# Patient Record
Sex: Female | Born: 1935 | Hispanic: No | Smoking: Never smoker
Health system: Southern US, Community
[De-identification: ages and names within clinical notes are randomized; demographics above are authoritative.]

## PROBLEM LIST (undated history)

## (undated) DIAGNOSIS — M1990 Unspecified osteoarthritis, unspecified site: Secondary | ICD-10-CM

## (undated) HISTORY — DX: Unspecified osteoarthritis, unspecified site: M19.90

---

## 2005-01-08 ENCOUNTER — Emergency Department: Payer: Self-pay | Admitting: Emergency Medicine

## 2005-01-08 ENCOUNTER — Other Ambulatory Visit: Payer: Self-pay

## 2006-02-02 ENCOUNTER — Ambulatory Visit: Payer: Self-pay | Admitting: Neurology

## 2007-04-11 ENCOUNTER — Ambulatory Visit: Payer: Self-pay | Admitting: Internal Medicine

## 2008-08-20 ENCOUNTER — Ambulatory Visit: Payer: Self-pay | Admitting: Internal Medicine

## 2008-11-11 ENCOUNTER — Ambulatory Visit: Payer: Self-pay | Admitting: Internal Medicine

## 2008-11-20 ENCOUNTER — Ambulatory Visit: Payer: Self-pay | Admitting: Internal Medicine

## 2009-03-10 ENCOUNTER — Ambulatory Visit: Payer: Self-pay | Admitting: Internal Medicine

## 2009-10-14 IMAGING — CR DG CHEST 2V
1 series · 2 of 2 positions shown · non-contrast
Comparison: none

REASON FOR EXAM: shortness of breath
COMMENTS:

PROCEDURE:     DXR - DXR CHEST PA (OR AP) AND LATERAL  - November 11, 2008  [DATE]
RESULT:     The lung fields are clear. No pneumonia, pneumothorax or pleural
effusion is seen. Heart size is normal. No significant osseous abnormalities
are identified.

[Series 1: view not recorded · 0.17mm/px · 2 of 2 slices shown]
[im 1/2]
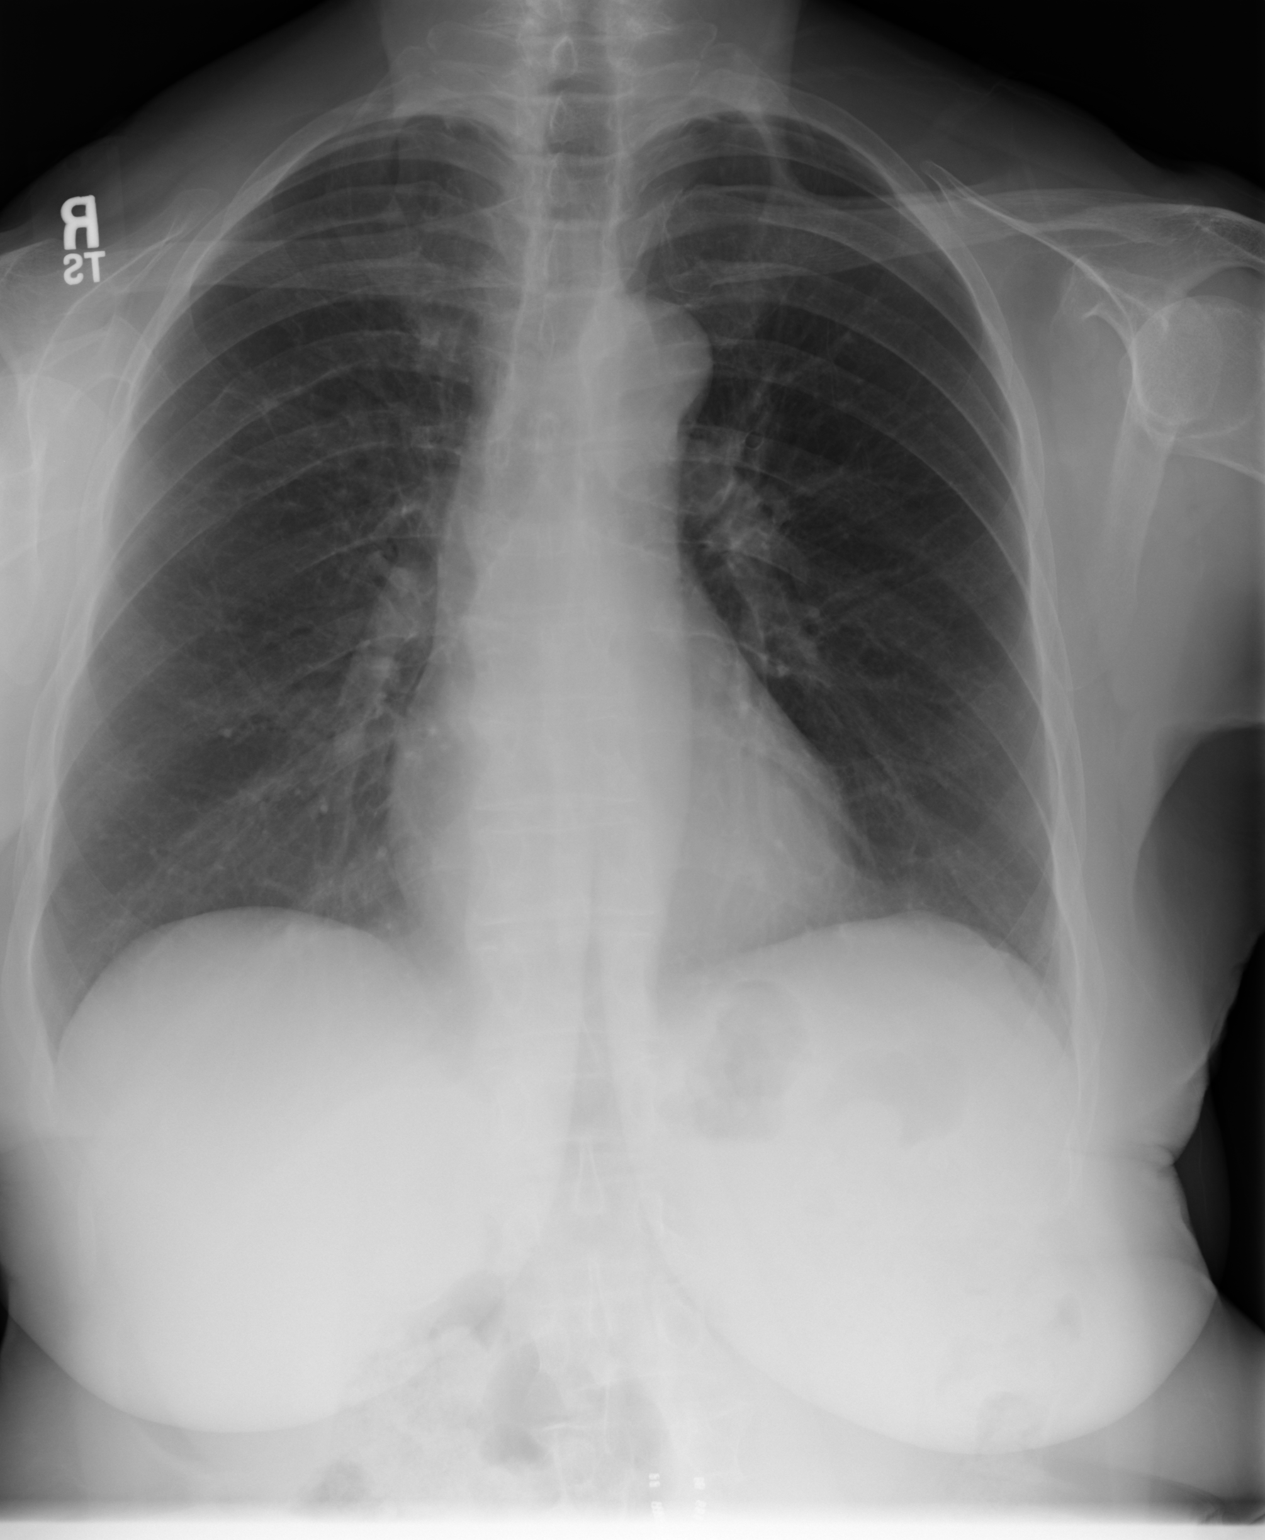
[im 2/2]
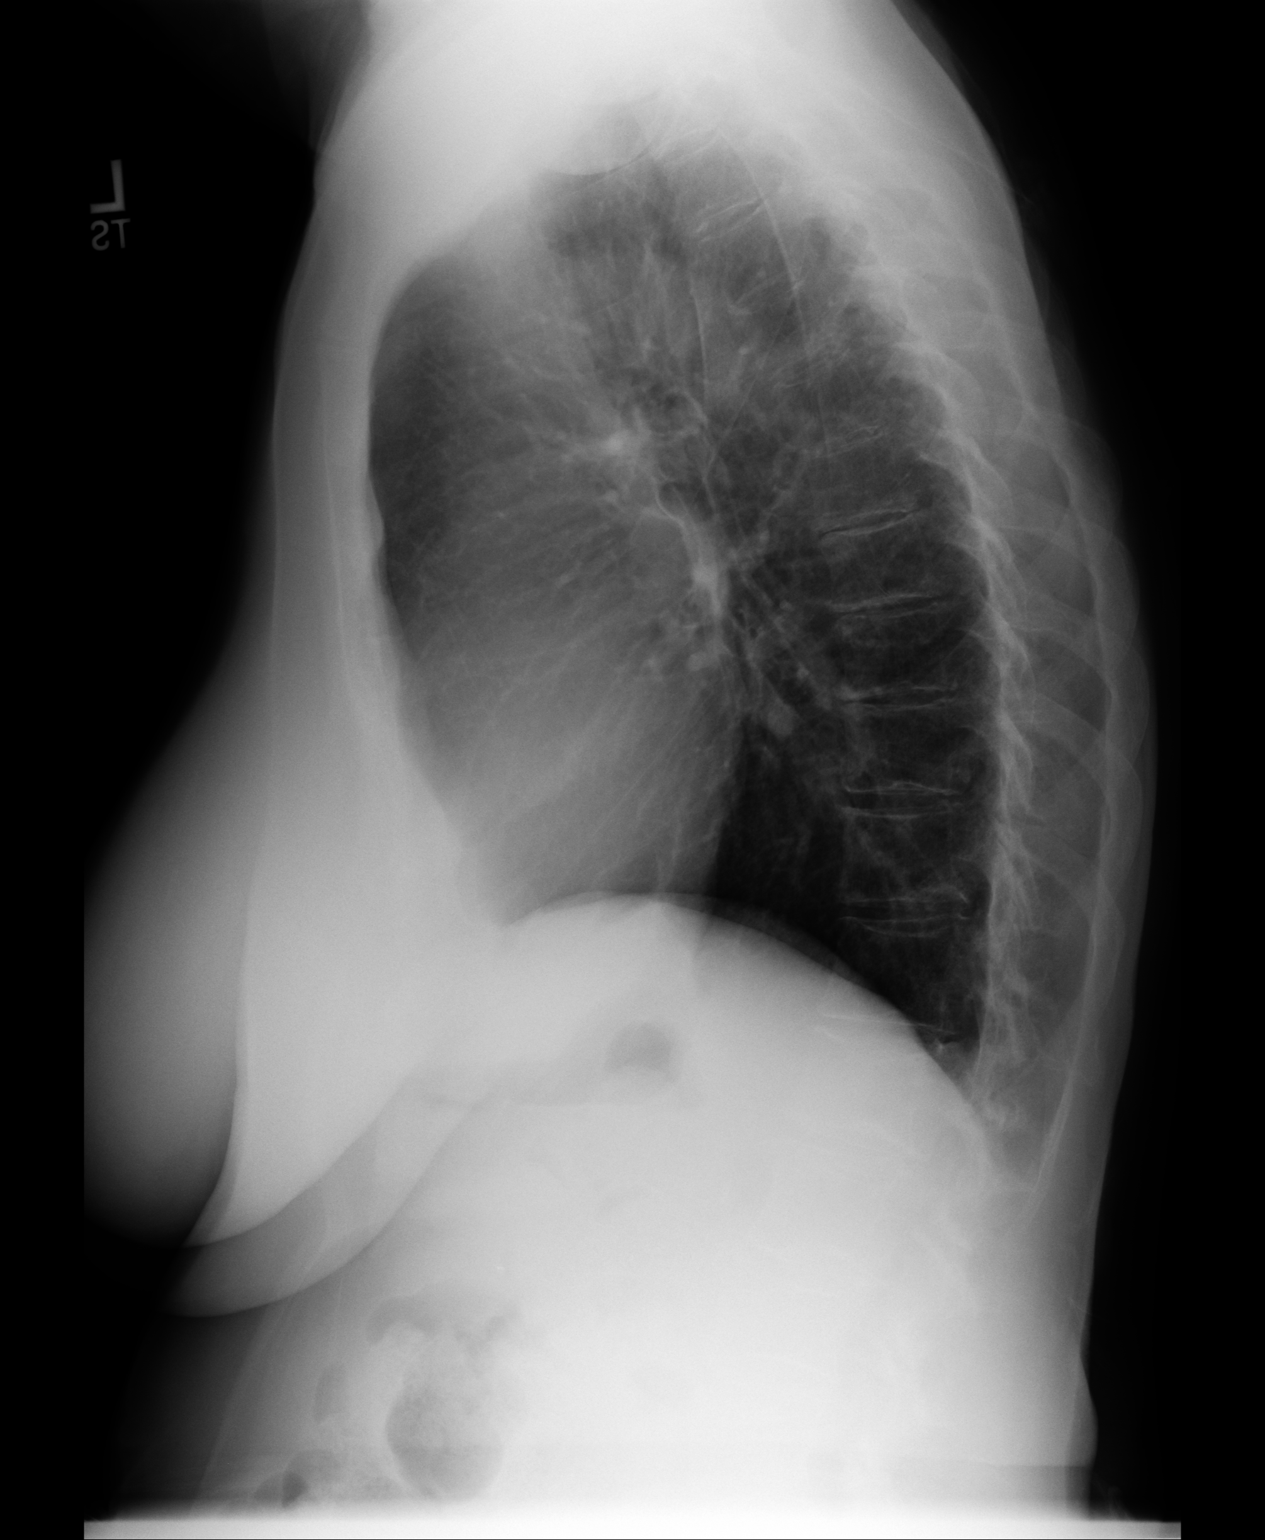

[2 of 2 positions shown; findings below may reference images not displayed]

IMPRESSION: 1.     No significant abnormalities are noted.

## 2012-04-26 ENCOUNTER — Emergency Department: Payer: Self-pay | Admitting: Emergency Medicine

## 2012-04-26 LAB — PROTIME-INR: Prothrombin Time: 13.3 secs (ref 11.5–14.7)

## 2012-04-26 LAB — COMPREHENSIVE METABOLIC PANEL
Bilirubin,Total: 0.3 mg/dL (ref 0.2–1.0)
Chloride: 103 mmol/L (ref 98–107)
Co2: 32 mmol/L (ref 21–32)
Creatinine: 0.93 mg/dL (ref 0.60–1.30)
EGFR (African American): >60
EGFR (Non-African Amer.): 59 — ABNORMAL LOW
Osmolality: 279 (ref 275–301)
SGOT(AST): 21 U/L (ref 15–37)
SGPT (ALT): 16 U/L (ref 12–78)
Sodium: 140 mmol/L (ref 136–145)

## 2012-04-26 LAB — CK TOTAL AND CKMB (NOT AT ARMC)
CK, Total: 52 U/L (ref 21–215)
CK-MB: 0.8 ng/mL (ref 0.5–3.6)

## 2012-04-26 LAB — CBC
HCT: 36.9 % (ref 35.0–47.0)
HGB: 12.4 g/dL (ref 12.0–16.0)
MCHC: 33.5 g/dL (ref 32.0–36.0)
MCV: 98 fL (ref 80–100)
RBC: 3.76 10*6/uL — ABNORMAL LOW (ref 3.80–5.20)
RDW: 13.2 % (ref 11.5–14.5)
WBC: 5.3 10*3/uL (ref 3.6–11.0)

## 2012-08-21 ENCOUNTER — Ambulatory Visit: Payer: Self-pay | Admitting: Internal Medicine

## 2012-12-25 ENCOUNTER — Ambulatory Visit: Payer: Self-pay | Admitting: Internal Medicine

## 2014-04-10 ENCOUNTER — Ambulatory Visit (INDEPENDENT_AMBULATORY_CARE_PROVIDER_SITE_OTHER): Payer: Medicare Other

## 2014-04-10 ENCOUNTER — Ambulatory Visit (INDEPENDENT_AMBULATORY_CARE_PROVIDER_SITE_OTHER): Payer: Medicare Other | Admitting: Podiatry

## 2014-04-10 ENCOUNTER — Encounter: Payer: Self-pay | Admitting: Podiatry

## 2014-04-10 VITALS — BP 149/84 | HR 67 | Resp 16 | Ht 62.0 in | Wt 130.0 lb

## 2014-04-10 DIAGNOSIS — G589 Mononeuropathy, unspecified: Secondary | ICD-10-CM

## 2014-04-10 DIAGNOSIS — G629 Polyneuropathy, unspecified: Secondary | ICD-10-CM

## 2014-04-10 DIAGNOSIS — B0229 Other postherpetic nervous system involvement: Secondary | ICD-10-CM

## 2014-04-10 NOTE — Progress Notes (Signed)
   Subjective:    Patient ID: Misty Townsend, female    DOB: 1931/12/02, 80 y.o.   MRN: [id number]  HPI Comments: i have a burning in my left foot. This has been going on for 1 month. Its getting better. The pain feels like a bolt of lightning. It hurts to wear shoes. i have to go bare foot. At night time the covers will hurt. i have to put my foot out of the cover. Its hot feeling at times. i take gabapentin and its helping.   Foot Pain      Review of Systems  HENT:       Ringing in ears  Musculoskeletal:       Back pain  Neurological: Positive for dizziness.  All other systems reviewed and are negative.      Objective:   Physical Exam: She has recently healed from the shingles that extended from her hip to the plantar aspect of her foot. She states that her primary Dr. his treating her for postherpetic neuralgia. Pulses are strongly palpable bilateral. Neurologic sensorium is intact per Semmes-Weinstein monofilament and hypersensitivity to the medial aspect of the first metatarsophalangeal joint area. She still has tenderness and pain on palpation dorsal aspect of the left foot. Deep tendon reflexes are intact bilateral muscle strength is 5 over 5 dorsiflexors plantar flexors inverters everters all intrinsic musculature is intact orthopedic evaluation demonstrates hallux abductovalgus deformity with mild pes planus bilateral flexible hammertoe deformities are noted bilateral. Otherwise all joints distal to the ankle a full range of motion without crepitation. Cutaneous evaluation demonstrates well hydrated cutis mild edema about the left foot no calf pain no ulcerations however she does have some dried that supple from the herpetic flare.        Assessment & Plan:  Assessment: Postherpetic neuralgia left foot.  Plan: Continue same course gabapentin 300 mg 1 by mouth 3 times a day followup with me on an as-needed basis. We did discuss the use of capsaicin cream. She cannot take  Lyrica.

## 2019-06-25 ENCOUNTER — Other Ambulatory Visit: Payer: Self-pay

## 2019-06-25 ENCOUNTER — Other Ambulatory Visit: Payer: Self-pay | Admitting: Internal Medicine

## 2019-06-25 ENCOUNTER — Ambulatory Visit
Admission: RE | Admit: 2019-06-25 | Discharge: 2019-06-25 | Disposition: A | Discharge status: Home / Self Care | Payer: Medicare Other | Source: Ambulatory Visit | Attending: Internal Medicine | Admitting: Internal Medicine

## 2019-06-25 DIAGNOSIS — R52 Pain, unspecified: Secondary | ICD-10-CM

## 2019-07-18 ENCOUNTER — Ambulatory Visit
Admission: RE | Admit: 2019-07-18 | Discharge: 2019-07-18 | Disposition: A | Discharge status: Home / Self Care | Payer: Medicare Other | Source: Ambulatory Visit | Attending: Internal Medicine | Admitting: Internal Medicine

## 2019-07-18 ENCOUNTER — Other Ambulatory Visit: Payer: Self-pay

## 2019-07-18 ENCOUNTER — Ambulatory Visit
Admission: RE | Admit: 2019-07-18 | Discharge: 2019-07-18 | Disposition: A | Discharge status: Home / Self Care | Payer: Medicare Other | Attending: Internal Medicine | Admitting: Internal Medicine

## 2019-07-18 ENCOUNTER — Other Ambulatory Visit: Payer: Self-pay | Admitting: Internal Medicine

## 2019-07-18 DIAGNOSIS — M1611 Unilateral primary osteoarthritis, right hip: Secondary | ICD-10-CM | POA: Diagnosis present

## 2019-07-18 DIAGNOSIS — M25551 Pain in right hip: Secondary | ICD-10-CM | POA: Diagnosis present

## 2019-11-25 ENCOUNTER — Encounter: Payer: Self-pay | Admitting: Internal Medicine

## 2019-12-20 ENCOUNTER — Ambulatory Visit: Payer: Medicare Other | Admitting: Internal Medicine

## 2020-01-01 ENCOUNTER — Ambulatory Visit: Payer: Medicare Other | Admitting: Internal Medicine

## 2020-01-08 ENCOUNTER — Ambulatory Visit (INDEPENDENT_AMBULATORY_CARE_PROVIDER_SITE_OTHER): Payer: Medicare Other | Admitting: Internal Medicine

## 2020-01-08 ENCOUNTER — Other Ambulatory Visit: Payer: Self-pay

## 2020-01-08 ENCOUNTER — Telehealth: Payer: Self-pay

## 2020-01-08 ENCOUNTER — Encounter: Payer: Self-pay | Admitting: Internal Medicine

## 2020-01-08 VITALS — BP 159/79 | HR 82 | Wt 110.1 lb

## 2020-01-08 DIAGNOSIS — G729 Myopathy, unspecified: Secondary | ICD-10-CM | POA: Diagnosis not present

## 2020-01-08 DIAGNOSIS — I10 Essential (primary) hypertension: Secondary | ICD-10-CM | POA: Diagnosis not present

## 2020-01-08 MED ORDER — METOPROLOL SUCCINATE ER 25 MG PO TB24: 25.0000 mg | ORAL_TABLET | Freq: Every day | ORAL | 3 refills | Status: DC

## 2020-01-08 MED ORDER — MELOXICAM 15 MG PO TABS: 15.0000 mg | ORAL_TABLET | Freq: Every day | ORAL | 6 refills | Status: DC

## 2020-01-08 NOTE — Assessment & Plan Note (Signed)
We will start her on Metroprolol.

## 2020-01-08 NOTE — Assessment & Plan Note (Signed)
We will provide her with a raised commode.  And she will need physical therapy exercises.  She can get up from the chair to standing position by herself.

## 2020-01-08 NOTE — Progress Notes (Signed)
Established Patient Office Visit  Subjective:  Patient ID: Misty Townsend, female    DOB: May 28, 1932  Age: 86 y.o. MRN: [id number]  CC:  Chief Complaint  Patient presents with  . Hypertension    patient states that her BP has been elevated and not taking anything for BP     HPI  Misty Townsend presents for her blood pressure check her blood pressure was found to be elevated.  She also complains of some tightness in the chest patient thinks is due to stress at home.  She also has muscle weakness and has trouble getting up from the sitting up position.  Denies any shortness of breath swelling of the legs abdominal pain.  History reviewed. No pertinent past medical history.  History reviewed. No pertinent surgical history.  History reviewed. No pertinent family history.  Social History   Socioeconomic History  . Marital status: Unknown    Spouse name: Not on file  . Number of children: Not on file  . Years of education: Not on file  . Highest education level: Not on file  Occupational History  . Not on file  Tobacco Use  . Smoking status: Never Smoker  Substance and Sexual Activity  . Alcohol use: No  . Drug use: Not on file  . Sexual activity: Not on file  Other Topics Concern  . Not on file  Social History Narrative  . Not on file   Social Determinants of Health   Financial Resource Strain:   . Difficulty of Paying Living Expenses:   Food Insecurity:   . Worried About Charity fundraiser in the Last Year:   . Arboriculturist in the Last Year:   Transportation Needs:   . Film/video editor (Medical):   Marland Kitchen Lack of Transportation (Non-Medical):   Physical Activity:   . Days of Exercise per Week:   . Minutes of Exercise per Session:   Stress:   . Feeling of Stress :   Social Connections:   . Frequency of Communication with Friends and Family:   . Frequency of Social Gatherings with Friends and Family:   . Attends Religious Services:   . Active Member of Clubs  or Organizations:   . Attends Archivist Meetings:   Marland Kitchen Marital Status:   Intimate Partner Violence:   . Fear of Current or Ex-Partner:   . Emotionally Abused:   Marland Kitchen Physically Abused:   . Sexually Abused:      Current Outpatient Medications:  .  meloxicam (MOBIC) 15 MG tablet, Take 1 tablet (15 mg total) by mouth daily., Disp: 30 tablet, Rfl: 6 .  metoprolol succinate (TOPROL-XL) 25 MG 24 hr tablet, Take 1 tablet (25 mg total) by mouth daily., Disp: 30 tablet, Rfl: 3   Allergies  Allergen Reactions  . Sulfa Antibiotics Other (See Comments)    Dry throat    ROS Review of Systems  Constitutional: Negative for appetite change.  HENT: Negative for congestion.   Eyes: Negative for pain.  Respiratory: Positive for chest tightness. Negative for shortness of breath.   Cardiovascular: Negative for leg swelling.  Gastrointestinal: Negative for abdominal pain.  Musculoskeletal: Negative for arthralgias and back pain.  Neurological: Negative for dizziness.  Psychiatric/Behavioral: Negative for behavioral problems.      Objective:    Physical Exam  Constitutional: She appears well-developed.  HENT:  Head: Normocephalic and atraumatic.  Eyes: Pupils are equal, round, and reactive to light.  Neck: No  tracheal deviation present. No thyromegaly present.  Cardiovascular: Normal rate.  Systolic murmur left sternal border.  Grade 2 / 6 crescendo decrescendo.  Musculoskeletal:     Cervical back: Normal range of motion and neck supple.    BP (!) 159/79   Pulse 82   Wt 110 lb 1.6 oz (49.9 kg)   BMI 20.14 kg/m  Wt Readings from Last 3 Encounters:  01/08/20 110 lb 1.6 oz (49.9 kg)  10/22/19 110 lb 6.1 oz (50.1 kg)  04/10/14 130 lb (59 kg)     Health Maintenance Due  Topic Date Due  . COVID-19 Vaccine (1) Never done  . TETANUS/TDAP  Never done  . DEXA SCAN  Never done  . PNA vac Low Risk Adult (1 of 2 - PCV13) Never done    There are no preventive care reminders to  display for this patient.  No results found for: TSH Lab Results  Component Value Date   WBC 5.3 04/26/2012   HGB 12.4 04/26/2012   HCT 36.9 04/26/2012   MCV 98 04/26/2012   PLT 180 04/26/2012   Lab Results  Component Value Date   NA 140 04/26/2012   K 4.1 04/26/2012   CO2 32 04/26/2012   GLUCOSE 101 (H) 04/26/2012   BUN 10 04/26/2012   CREATININE 0.93 04/26/2012   BILITOT 0.3 04/26/2012   ALKPHOS 86 04/26/2012   AST 21 04/26/2012   ALT 16 04/26/2012   PROT 7.6 04/26/2012   ALBUMIN 3.8 04/26/2012   CALCIUM 9.2 04/26/2012   ANIONGAP 5 (L) 04/26/2012   No results found for: CHOL No results found for: HDL No results found for: LDLCALC No results found for: TRIG No results found for: CHOLHDL No results found for: KVQQ5Z    Assessment & Plan:   Problem List Items Addressed This Visit      Cardiovascular and Mediastinum   Essential hypertension - Primary    We will start her on Metroprolol.      Relevant Medications   metoprolol succinate (TOPROL-XL) 25 MG 24 hr tablet     Other   Myopathy    We will provide her with a raised commode.  And she will need physical therapy exercises.  She can get up from the chair to standing position by herself.         Meds ordered this encounter  Medications  . meloxicam (MOBIC) 15 MG tablet    Sig: Take 1 tablet (15 mg total) by mouth daily.    Dispense:  30 tablet    Refill:  6  . metoprolol succinate (TOPROL-XL) 25 MG 24 hr tablet    Sig: Take 1 tablet (25 mg total) by mouth daily.    Dispense:  30 tablet    Refill:  3   1. Essential hypertension .  Start on metoprolol - metoprolol succinate (TOPROL-XL) 25 MG 24 hr tablet; Take 1 tablet (25 mg total) by mouth daily.  Dispense: 30 tablet; Ref.  Ill: 3  2. Myopathy Exercises. Follow-up: Return in about 4 weeks (around 02/05/2020).    Corky Downs, MD

## 2020-01-17 NOTE — Telephone Encounter (Signed)
Letter was done during office visit

## 2020-03-03 ENCOUNTER — Other Ambulatory Visit: Payer: Self-pay

## 2020-03-03 DIAGNOSIS — I10 Essential (primary) hypertension: Secondary | ICD-10-CM

## 2020-03-03 MED ORDER — METOPROLOL SUCCINATE ER 25 MG PO TB24: 25.0000 mg | ORAL_TABLET | Freq: Every day | ORAL | 3 refills | Status: DC

## 2020-03-03 MED ORDER — MELOXICAM 15 MG PO TABS: 15.0000 mg | ORAL_TABLET | Freq: Every day | ORAL | 6 refills | Status: DC

## 2020-03-10 ENCOUNTER — Ambulatory Visit (INDEPENDENT_AMBULATORY_CARE_PROVIDER_SITE_OTHER): Payer: Medicare Other | Admitting: Internal Medicine

## 2020-03-10 ENCOUNTER — Encounter: Payer: Self-pay | Admitting: Internal Medicine

## 2020-03-10 ENCOUNTER — Other Ambulatory Visit: Payer: Self-pay

## 2020-03-10 VITALS — BP 142/79 | HR 71 | Ht 59.0 in | Wt 109.1 lb

## 2020-03-10 DIAGNOSIS — M810 Age-related osteoporosis without current pathological fracture: Secondary | ICD-10-CM | POA: Diagnosis not present

## 2020-03-10 DIAGNOSIS — I358 Other nonrheumatic aortic valve disorders: Secondary | ICD-10-CM

## 2020-03-10 DIAGNOSIS — I10 Essential (primary) hypertension: Secondary | ICD-10-CM | POA: Diagnosis not present

## 2020-03-10 DIAGNOSIS — R2681 Unsteadiness on feet: Secondary | ICD-10-CM

## 2020-03-10 DIAGNOSIS — M1611 Unilateral primary osteoarthritis, right hip: Secondary | ICD-10-CM | POA: Diagnosis not present

## 2020-03-10 MED ORDER — ALENDRONATE SODIUM 70 MG PO TABS: 70.0000 mg | ORAL_TABLET | ORAL | 3 refills | Status: DC

## 2020-03-10 MED ORDER — VITAMIN D 25 MCG (1000 UNIT) PO TABS: 1000.0000 [IU] | ORAL_TABLET | Freq: Every day | ORAL | 3 refills | Status: DC

## 2020-03-10 MED ORDER — FOSAMAX PLUS D 70-5600 MG-UNIT PO TABS: 1.0000 | ORAL_TABLET | ORAL | 1 refills | Status: DC

## 2020-03-10 NOTE — Assessment & Plan Note (Signed)
The patient is tender at the right hip. X-ray several months ago revealed osteoarthritis of the right hip. I told her to use a cane when ambulating. She should not lift heavy items or drag items (I.e. trash cans) around. Part of the gait problem is due to neurodegenerative disorder of the brain.

## 2020-03-10 NOTE — Assessment & Plan Note (Signed)
The patient was advised to use a cane when ambulating.

## 2020-03-10 NOTE — Assessment & Plan Note (Signed)
-   Today, the patient's blood pressure is well managed on Metoprolol. - The patient will continue the current treatment regimen.  - I encouraged the patient to eat a low-sodium diet to help control blood pressure. - I encouraged the patient to live an active lifestyle and complete activities that increases heart rate to 85% target heart rate at least 5 times per week for one hour.   

## 2020-03-10 NOTE — Addendum Note (Signed)
Addended by: Jobie Quaker on: 03/10/2020 02:20 PM   Modules accepted: Orders

## 2020-03-10 NOTE — Assessment & Plan Note (Signed)
She does not have any syncope, chest pain, or arrhythmia.

## 2020-03-10 NOTE — Progress Notes (Signed)
Established Patient Office Visit  SUBJECTIVE:  Subjective  Patient ID: Misty Townsend, female    DOB: 1932/05/23  Age: 86 y.o. MRN: [id number]  CC:  Chief Complaint  Patient presents with  . Hypertension    2 month fu     HPI Misty Townsend is a 86 y.o. female presenting today for hypertension follow-up. She is doing well overall. She takes Metoprolol as prescribed.  Today, she reports chronic right hip pain secondary to arthritis. Pain is exacerbated when ambulating. She normally ambulates with the assistance of a cane. She denies shortness of breath, chest pain, and all other symptoms.  History reviewed. No pertinent past medical history.  History reviewed. No pertinent surgical history.  History reviewed. No pertinent family history.  Social History   Socioeconomic History  . Marital status: Unknown    Spouse name: Not on file  . Number of children: Not on file  . Years of education: Not on file  . Highest education level: Not on file  Occupational History  . Not on file  Tobacco Use  . Smoking status: Never Smoker  . Smokeless tobacco: Never Used  Substance and Sexual Activity  . Alcohol use: No  . Drug use: Not on file  . Sexual activity: Not on file  Other Topics Concern  . Not on file  Social History Narrative  . Not on file   Social Determinants of Health   Financial Resource Strain:   . Difficulty of Paying Living Expenses:   Food Insecurity:   . Worried About Programme researcher, broadcasting/film/video in the Last Year:   . Barista in the Last Year:   Transportation Needs:   . Freight forwarder (Medical):   Marland Kitchen Lack of Transportation (Non-Medical):   Physical Activity:   . Days of Exercise per Week:   . Minutes of Exercise per Session:   Stress:   . Feeling of Stress :   Social Connections:   . Frequency of Communication with Friends and Family:   . Frequency of Social Gatherings with Friends and Family:   . Attends Religious Services:   . Active Member of  Clubs or Organizations:   . Attends Banker Meetings:   Marland Kitchen Marital Status:   Intimate Partner Violence:   . Fear of Current or Ex-Partner:   . Emotionally Abused:   Marland Kitchen Physically Abused:   . Sexually Abused:      Current Outpatient Medications:  .  meloxicam (MOBIC) 15 MG tablet, Take 1 tablet (15 mg total) by mouth daily., Disp: 30 tablet, Rfl: 6 .  metoprolol succinate (TOPROL-XL) 25 MG 24 hr tablet, Take 1 tablet (25 mg total) by mouth daily., Disp: 30 tablet, Rfl: 3 .  alendronate-cholecalciferol (FOSAMAX PLUS D) 70-5600 MG-UNIT tablet, Take 1 tablet by mouth every 7 (seven) days. Take with a full glass of water on an empty stomach., Disp: 12 tablet, Rfl: 1   Allergies  Allergen Reactions  . Sulfa Antibiotics Other (See Comments)    Dry throat    ROS Review of Systems  Constitutional: Negative.   HENT: Negative.   Eyes: Negative.   Respiratory: Negative for shortness of breath.   Cardiovascular: Negative for chest pain.  Gastrointestinal: Negative.   Endocrine: Negative.   Genitourinary: Negative.   Musculoskeletal:       Reports chronic right hip pain secondary to arthritis  Skin: Negative.   Allergic/Immunologic: Negative.   Neurological: Negative.   Hematological: Negative.  Psychiatric/Behavioral: Negative.   All other systems reviewed and are negative.    OBJECTIVE:    Physical Exam Vitals reviewed.  Constitutional:      Appearance: Normal appearance.  HENT:     Mouth/Throat:     Mouth: Mucous membranes are moist.  Eyes:     Pupils: Pupils are equal, round, and reactive to light.  Neck:     Vascular: No carotid bruit.  Cardiovascular:     Rate and Rhythm: Normal rate and regular rhythm.     Pulses: Normal pulses.     Heart sounds: Murmur heard.  Systolic murmur is present with a grade of 2/6.   Pulmonary:     Effort: Pulmonary effort is normal.     Breath sounds: Normal breath sounds.  Abdominal:     General: Bowel sounds are  normal.     Palpations: Abdomen is soft. There is no hepatomegaly, splenomegaly or mass.     Tenderness: There is no abdominal tenderness.     Hernia: No hernia is present.  Musculoskeletal:     Cervical back: Neck supple.     Right hip: Tenderness present.     Right lower leg: No edema.     Left lower leg: No edema.     Comments: Scoliosis and kyphosis  Skin:    Findings: No rash.  Neurological:     Mental Status: She is alert and oriented to person, place, and time.     Motor: No weakness.     Gait: Gait abnormal.  Psychiatric:        Mood and Affect: Mood and affect normal.        Behavior: Behavior normal.     BP (!) 142/79   Pulse 71   Ht 4\' 11"  (1.499 m)   Wt 109 lb 1.6 oz (49.5 kg)   BMI 22.04 kg/m  Wt Readings from Last 3 Encounters:  03/10/20 109 lb 1.6 oz (49.5 kg)  01/08/20 110 lb 1.6 oz (49.9 kg)  10/22/19 110 lb 6.1 oz (50.1 kg)    Health Maintenance Due  Topic Date Due  . COVID-19 Vaccine (1) Never done  . TETANUS/TDAP  Never done  . DEXA SCAN  Never done  . PNA vac Low Risk Adult (1 of 2 - PCV13) Never done  . INFLUENZA VACCINE  03/09/2020    There are no preventive care reminders to display for this patient.  CBC Latest Ref Rng & Units 04/26/2012  WBC 3.6 - 11.0 x10 3/mm 3 5.3  Hemoglobin 12.0 - 16.0 g/dL 04/28/2012  Hematocrit 88.6 - 47.0 % 36.9  Platelets 150 - 440 x10 3/mm 3 180   CMP Latest Ref Rng & Units 04/26/2012  Glucose 65 - 99 mg/dL 04/28/2012)  BUN 7 - 18 mg/dL 10  Creatinine 736(K - 8.15 mg/dL 9.47  Sodium 0.76 - 151 mmol/L 140  Potassium 3.5 - 5.1 mmol/L 4.1  Chloride 98 - 107 mmol/L 103  CO2 21 - 32 mmol/L 32  Calcium 8.5 - 10.1 mg/dL 9.2  Total Protein 6.4 - 8.2 g/dL 7.6  Total Bilirubin 0.2 - 1.0 mg/dL 0.3  Alkaline Phos 50 - 136 Unit/L 86  AST 15 - 37 Unit/L 21  ALT 12 - 78 U/L 16    No results found for: TSH Lab Results  Component Value Date   ALBUMIN 3.8 04/26/2012   ANIONGAP 5 (L) 04/26/2012   No results found for: CHOL,  HDL, LDLCALC, CHOLHDL No results found for: TRIG  No results found for: HGBA1C    ASSESSMENT & PLAN:   Problem List Items Addressed This Visit      Cardiovascular and Mediastinum   Essential hypertension    - Today, the patient's blood pressure is well managed on Metoprolol. - The patient will continue the current treatment regimen.  - I encouraged the patient to eat a low-sodium diet to help control blood pressure. - I encouraged the patient to live an active lifestyle and complete activities that increases heart rate to 85% target heart rate at least 5 times per week for one hour.          Musculoskeletal and Integument   Age related osteoporosis - Primary    Started on Fosamax.       Relevant Medications   alendronate-cholecalciferol (FOSAMAX PLUS D) 70-5600 MG-UNIT tablet   Arthritis of right hip    The patient is tender at the right hip. X-ray several months ago revealed osteoarthritis of the right hip. I told her to use a cane when ambulating. She should not lift heavy items or drag items (I.e. trash cans) around. Part of the gait problem is due to neurodegenerative disorder of the brain.         Other   Unsteady gait when walking    The patient was advised to use a cane when ambulating.       Heart murmur, aortic    She does not have any syncope, chest pain, or arrhythmia.          Meds ordered this encounter  Medications  . alendronate-cholecalciferol (FOSAMAX PLUS D) 70-5600 MG-UNIT tablet    Sig: Take 1 tablet by mouth every 7 (seven) days. Take with a full glass of water on an empty stomach.    Dispense:  12 tablet    Refill:  1    Follow-up: Return in about 2 months (around 05/10/2020).    Dr. Woodroe Chen [facility] [address] [place], [place] [address]   By signing my name below, I, Nikki Dom, attest that this documentation has been prepared under the direction and in the presence of Corky Downs, MD. Electronically  Signed: Corky Downs, MD 03/10/20, 12:49 PM   I personally performed the services described in this documentation, which was SCRIBED in my presence. The recorded information has been reviewed and considered accurate. It has been edited as necessary during review. Corky Downs, MD

## 2020-03-10 NOTE — Assessment & Plan Note (Signed)
Started on Fosamax.

## 2020-05-12 ENCOUNTER — Other Ambulatory Visit: Payer: Self-pay

## 2020-05-12 ENCOUNTER — Ambulatory Visit (INDEPENDENT_AMBULATORY_CARE_PROVIDER_SITE_OTHER): Payer: Medicare Other | Admitting: Internal Medicine

## 2020-05-12 ENCOUNTER — Encounter: Payer: Self-pay | Admitting: Internal Medicine

## 2020-05-12 VITALS — BP 179/96 | HR 68 | Ht 62.0 in | Wt 109.3 lb

## 2020-05-12 DIAGNOSIS — Z0000 Encounter for general adult medical examination without abnormal findings: Secondary | ICD-10-CM | POA: Diagnosis not present

## 2020-05-12 DIAGNOSIS — R2681 Unsteadiness on feet: Secondary | ICD-10-CM

## 2020-05-12 DIAGNOSIS — I358 Other nonrheumatic aortic valve disorders: Secondary | ICD-10-CM | POA: Diagnosis not present

## 2020-05-12 DIAGNOSIS — M1611 Unilateral primary osteoarthritis, right hip: Secondary | ICD-10-CM

## 2020-05-12 DIAGNOSIS — I10 Essential (primary) hypertension: Secondary | ICD-10-CM | POA: Diagnosis not present

## 2020-05-12 DIAGNOSIS — M810 Age-related osteoporosis without current pathological fracture: Secondary | ICD-10-CM | POA: Diagnosis not present

## 2020-05-12 DIAGNOSIS — Z23 Encounter for immunization: Secondary | ICD-10-CM

## 2020-05-12 NOTE — Assessment & Plan Note (Signed)
Patient takes vitamin D also is on Fosamax.

## 2020-05-12 NOTE — Progress Notes (Signed)
Established Patient Office Visit  SUBJECTIVE:  Subjective  Patient ID: Misty Townsend, female    DOB: 1932/07/28  Age: 86 y.o. MRN: [id number]  CC:  Chief Complaint  Patient presents with  . Annual Exam    HPI Misty Townsend is a 86 y.o. female presenting today for an annual physical.  She notes that she twisted her right hip recently while trying to put on her socks and it has been hurting her a lot. She is frustrated, but she deals with the pain of her arthritis a lot so she is used to it.   Past Medical History:  Diagnosis Date  . Arthritis     No past surgical history on file.  No family history on file.  Social History   Socioeconomic History  . Marital status: Unknown    Spouse name: Not on file  . Number of children: Not on file  . Years of education: Not on file  . Highest education level: Not on file  Occupational History  . Not on file  Tobacco Use  . Smoking status: Never Smoker  . Smokeless tobacco: Never Used  Substance and Sexual Activity  . Alcohol use: No  . Drug use: Not on file  . Sexual activity: Not on file  Other Topics Concern  . Not on file  Social History Narrative  . Not on file   Social Determinants of Health   Financial Resource Strain:   . Difficulty of Paying Living Expenses: Not on file  Food Insecurity:   . Worried About Programme researcher, broadcasting/film/video in the Last Year: Not on file  . Ran Out of Food in the Last Year: Not on file  Transportation Needs:   . Lack of Transportation (Medical): Not on file  . Lack of Transportation (Non-Medical): Not on file  Physical Activity:   . Days of Exercise per Week: Not on file  . Minutes of Exercise per Session: Not on file  Stress:   . Feeling of Stress : Not on file  Social Connections:   . Frequency of Communication with Friends and Family: Not on file  . Frequency of Social Gatherings with Friends and Family: Not on file  . Attends Religious Services: Not on file  . Active Member of Clubs or  Organizations: Not on file  . Attends Banker Meetings: Not on file  . Marital Status: Not on file  Intimate Partner Violence:   . Fear of Current or Ex-Partner: Not on file  . Emotionally Abused: Not on file  . Physically Abused: Not on file  . Sexually Abused: Not on file     Current Outpatient Medications:  .  alendronate (FOSAMAX) 70 MG tablet, Take 1 tablet (70 mg total) by mouth once a week. Take with a full glass of water on an empty stomach., Disp: 4 tablet, Rfl: 3 .  cholecalciferol (VITAMIN D3) 25 MCG (1000 UNIT) tablet, Take 1 tablet (1,000 Units total) by mouth daily., Disp: 30 tablet, Rfl: 3 .  meloxicam (MOBIC) 15 MG tablet, Take 1 tablet (15 mg total) by mouth daily., Disp: 30 tablet, Rfl: 6 .  metoprolol succinate (TOPROL-XL) 25 MG 24 hr tablet, Take 1 tablet (25 mg total) by mouth daily., Disp: 30 tablet, Rfl: 3   Allergies  Allergen Reactions  . Sulfa Antibiotics Other (See Comments)    Dry throat    ROS Review of Systems  Constitutional: Negative.   HENT: Negative.   Eyes: Negative.  Respiratory: Negative.   Cardiovascular: Negative.   Gastrointestinal: Negative.   Endocrine: Negative.   Genitourinary: Negative.   Musculoskeletal: Positive for arthralgias (R hip).  Skin: Negative.   Allergic/Immunologic: Negative.   Neurological: Negative.   Hematological: Negative.   Psychiatric/Behavioral: The patient is nervous/anxious.   All other systems reviewed and are negative.    OBJECTIVE:    Physical Exam Vitals reviewed.  Constitutional:      Appearance: Normal appearance.     Comments: Ambulates with a cane  HENT:     Mouth/Throat:     Mouth: Mucous membranes are moist.  Eyes:     Pupils: Pupils are equal, round, and reactive to light.  Neck:     Vascular: No carotid bruit.  Cardiovascular:     Rate and Rhythm: Normal rate and regular rhythm.     Pulses: Normal pulses.     Heart sounds: Murmur heard.  Systolic murmur is present  with a grade of 2/6.   Pulmonary:     Effort: Pulmonary effort is normal.     Breath sounds: Normal breath sounds.  Abdominal:     General: Bowel sounds are normal.     Palpations: Abdomen is soft. There is no hepatomegaly, splenomegaly or mass.     Tenderness: There is no abdominal tenderness.     Hernia: No hernia is present.  Musculoskeletal:        General: No tenderness.     Cervical back: Neck supple.     Thoracic back: Scoliosis present.     Right lower leg: No edema.     Left lower leg: No edema.  Skin:    Findings: No rash.  Neurological:     Mental Status: She is alert and oriented to person, place, and time.     Motor: No weakness.     Gait: Gait abnormal (unsteady).  Psychiatric:        Mood and Affect: Mood and affect normal.        Behavior: Behavior normal.        Cognition and Memory: Memory is not impaired. She does not exhibit impaired recent memory.     BP (!) 179/96   Pulse 68   Ht 5\' 2"  (1.575 m)   Wt 109 lb 4.8 oz (49.6 kg)   BMI 19.99 kg/m  Wt Readings from Last 3 Encounters:  05/12/20 109 lb 4.8 oz (49.6 kg)  03/10/20 109 lb 1.6 oz (49.5 kg)  01/08/20 110 lb 1.6 oz (49.9 kg)    Health Maintenance Due  Topic Date Due  . COVID-19 Vaccine (1) Never done  . TETANUS/TDAP  Never done  . DEXA SCAN  Never done  . PNA vac Low Risk Adult (1 of 2 - PCV13) Never done    There are no preventive care reminders to display for this patient.  CBC Latest Ref Rng & Units 04/26/2012  WBC 3.6 - 11.0 x10 3/mm 3 5.3  Hemoglobin 12.0 - 16.0 g/dL 04/28/2012  Hematocrit 96.2 - 47.0 % 36.9  Platelets 150 - 440 x10 3/mm 3 180   CMP Latest Ref Rng & Units 04/26/2012  Glucose 65 - 99 mg/dL 04/28/2012)  BUN 7 - 18 mg/dL 10  Creatinine 841(L - 2.44 mg/dL 0.10  Sodium 2.72 - 536 mmol/L 140  Potassium 3.5 - 5.1 mmol/L 4.1  Chloride 98 - 107 mmol/L 103  CO2 21 - 32 mmol/L 32  Calcium 8.5 - 10.1 mg/dL 9.2  Total Protein 6.4 - 8.2  g/dL 7.6  Total Bilirubin 0.2 - 1.0 mg/dL 0.3    Alkaline Phos 50 - 136 Unit/L 86  AST 15 - 37 Unit/L 21  ALT 12 - 78 U/L 16    No results found for: TSH Lab Results  Component Value Date   ALBUMIN 3.8 04/26/2012   ANIONGAP 5 (L) 04/26/2012   No results found for: CHOL, HDL, LDLCALC, CHOLHDL No results found for: TRIG No results found for: HGBA1C    ASSESSMENT & PLAN:   Problem List Items Addressed This Visit      Cardiovascular and Mediastinum   Essential hypertension    - Today, the patient's blood pressure is well managed on metoprolol. - The patient will continue the current treatment regimen.  - I encouraged the patient to eat a low-sodium diet to help control blood pressure. - I encouraged the patient to live an active lifestyle and complete activities that increases heart rate to 85% target heart rate at least 5 times per week for one hour.            Musculoskeletal and Integument   Age related osteoporosis    Patient takes vitamin D also is on Fosamax.      Arthritis of right hip    Patient states that she twisted her right hip while getting up she complains of pain.  Patient has seen the orthopedic surgeon in the past so I asked her to going to see the orthopedic surgeon and he can do the x-ray of the right hip and plan for further treatment.  Patient is little bit unsteady on her gait, walks with the help of a stick.  She can drive.  She is mentally alert and able to make her own decisions.  She also has kyphosis and osteoporosis and takes medicine for that.        Other   Unsteady gait when walking   Heart murmur, aortic    Patient has a murmur of aortic stenosis.  She does not have any signs of congestive heart failure.  She denies any chest pain or shortness of breath at night.  Denies any history of palpitations or syncope.       Other Visit Diagnoses    Need for immunization against influenza    -  Primary   Relevant Orders   Flu Vaccine QUAD High Dose(Fluad) (Completed)      No orders of  the defined types were placed in this encounter.   Follow-up: No follow-ups on file.    Corky Downs, MD [facility] [address] [place], [place] [address]   By signing my name below, I, [employer], attest that this documentation has been prepared under the direction and in the presence of Dr. Corky Downs Electronically Signed: Corky Downs, MD 05/12/20, 11:31 AM  I personally performed the services described in this documentation, which was SCRIBED in my presence. The recorded information has been reviewed and considered accurate. It has been edited as necessary during review. Corky Downs, MD

## 2020-05-12 NOTE — Assessment & Plan Note (Signed)
Patient states that she twisted her right hip while getting up she complains of pain.  Patient has seen the orthopedic surgeon in the past so I asked her to going to see the orthopedic surgeon and he can do the x-ray of the right hip and plan for further treatment.  Patient is little bit unsteady on her gait, walks with the help of a stick.  She can drive.  She is mentally alert and able to make her own decisions.  She also has kyphosis and osteoporosis and takes medicine for that.

## 2020-05-12 NOTE — Assessment & Plan Note (Signed)
Patient has a murmur of aortic stenosis.  She does not have any signs of congestive heart failure.  She denies any chest pain or shortness of breath at night.  Denies any history of palpitations or syncope.

## 2020-05-12 NOTE — Assessment & Plan Note (Signed)
-   Today, the patient's blood pressure is well managed on  metoprolol . - The patient will continue the current treatment regimen.  - I encouraged the patient to eat a low-sodium diet to help control blood pressure. - I encouraged the patient to live an active lifestyle and complete activities that increases heart rate to 85% target heart rate at least 5 times per week for one hour.    

## 2020-05-27 IMAGING — CR DG HIP (WITH OR WITHOUT PELVIS) 2-3V*R*
1 series · 3 of 3 positions shown · non-contrast
Comparison: Remote right hip radiograph 08/20/2008

CLINICAL DATA: Right hip pain. Fall landing on toilet 2 weeks ago.

EXAM:
DG HIP (WITH OR WITHOUT PELVIS) 2-3V RIGHT

[Series 1: dg hip unilat w or w/o pelvis 2-3 views  · non-contrast · 0.14mm/px · 3 of 3 slices shown]
[im 1/3]
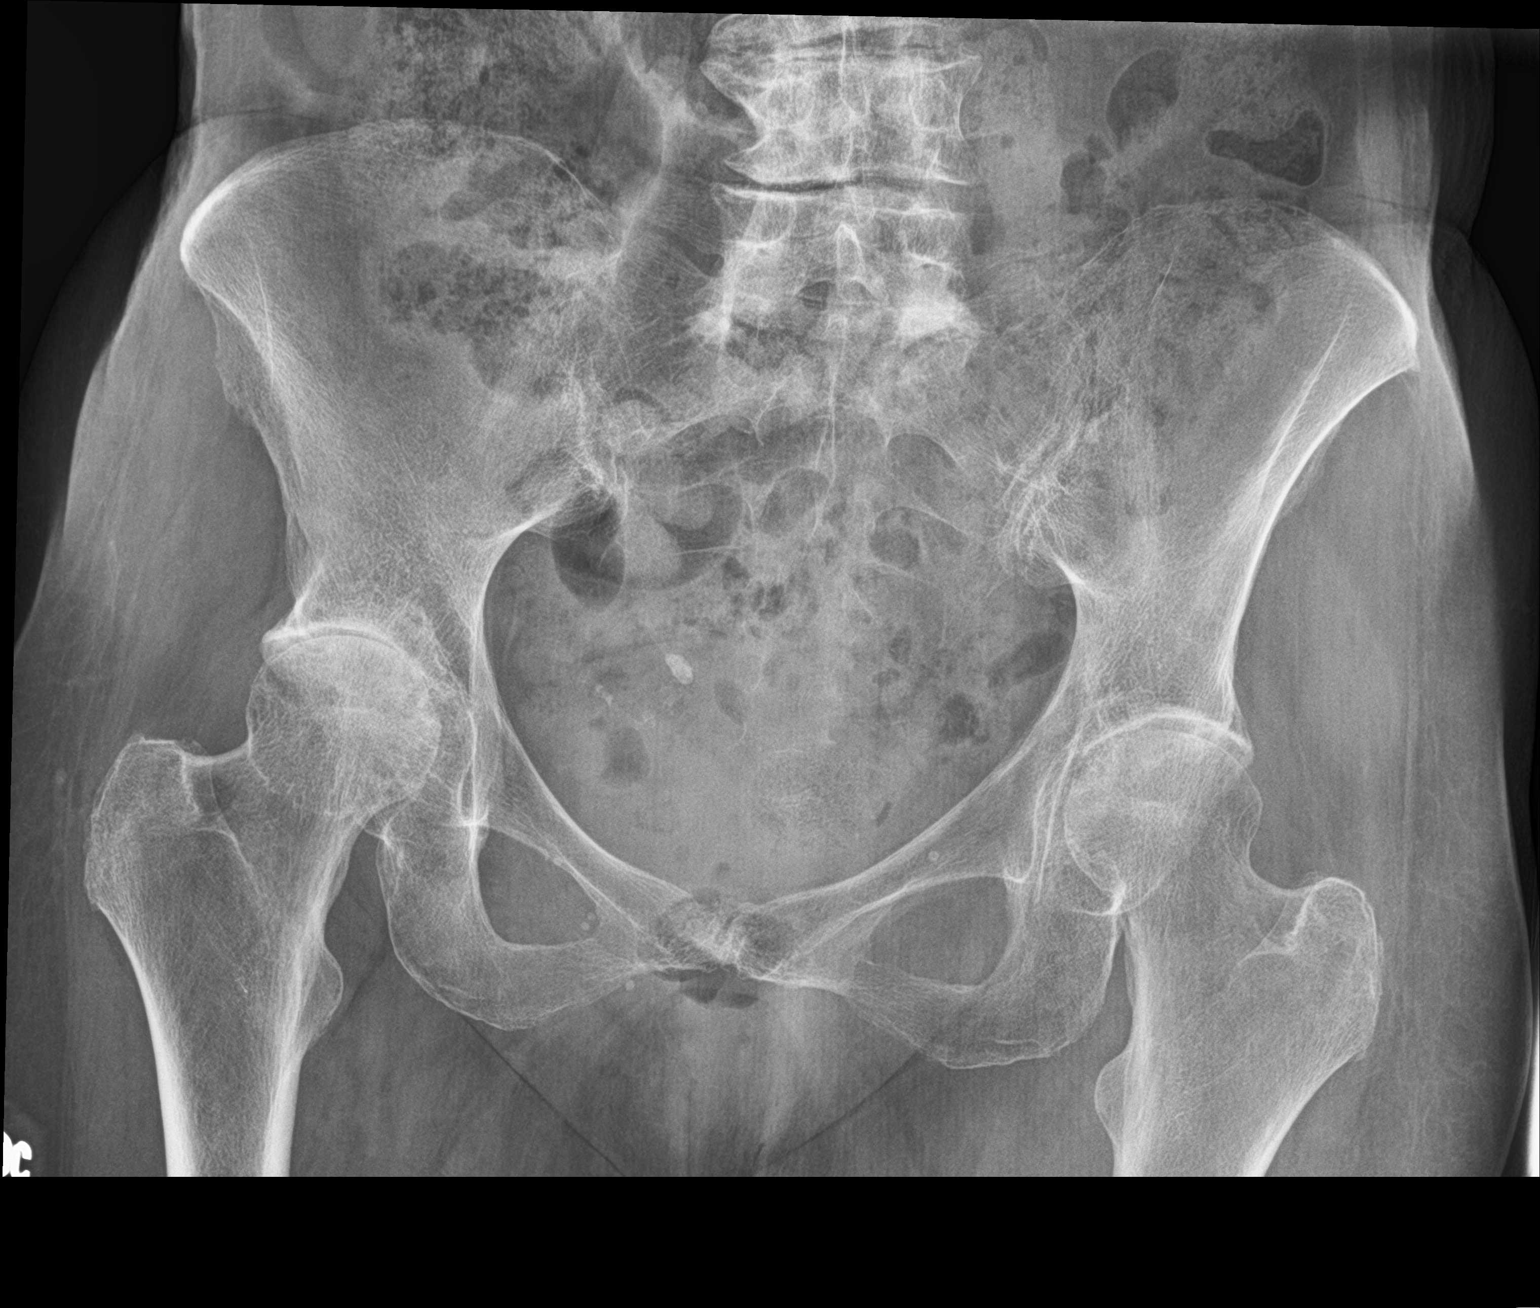
[im 2/3]
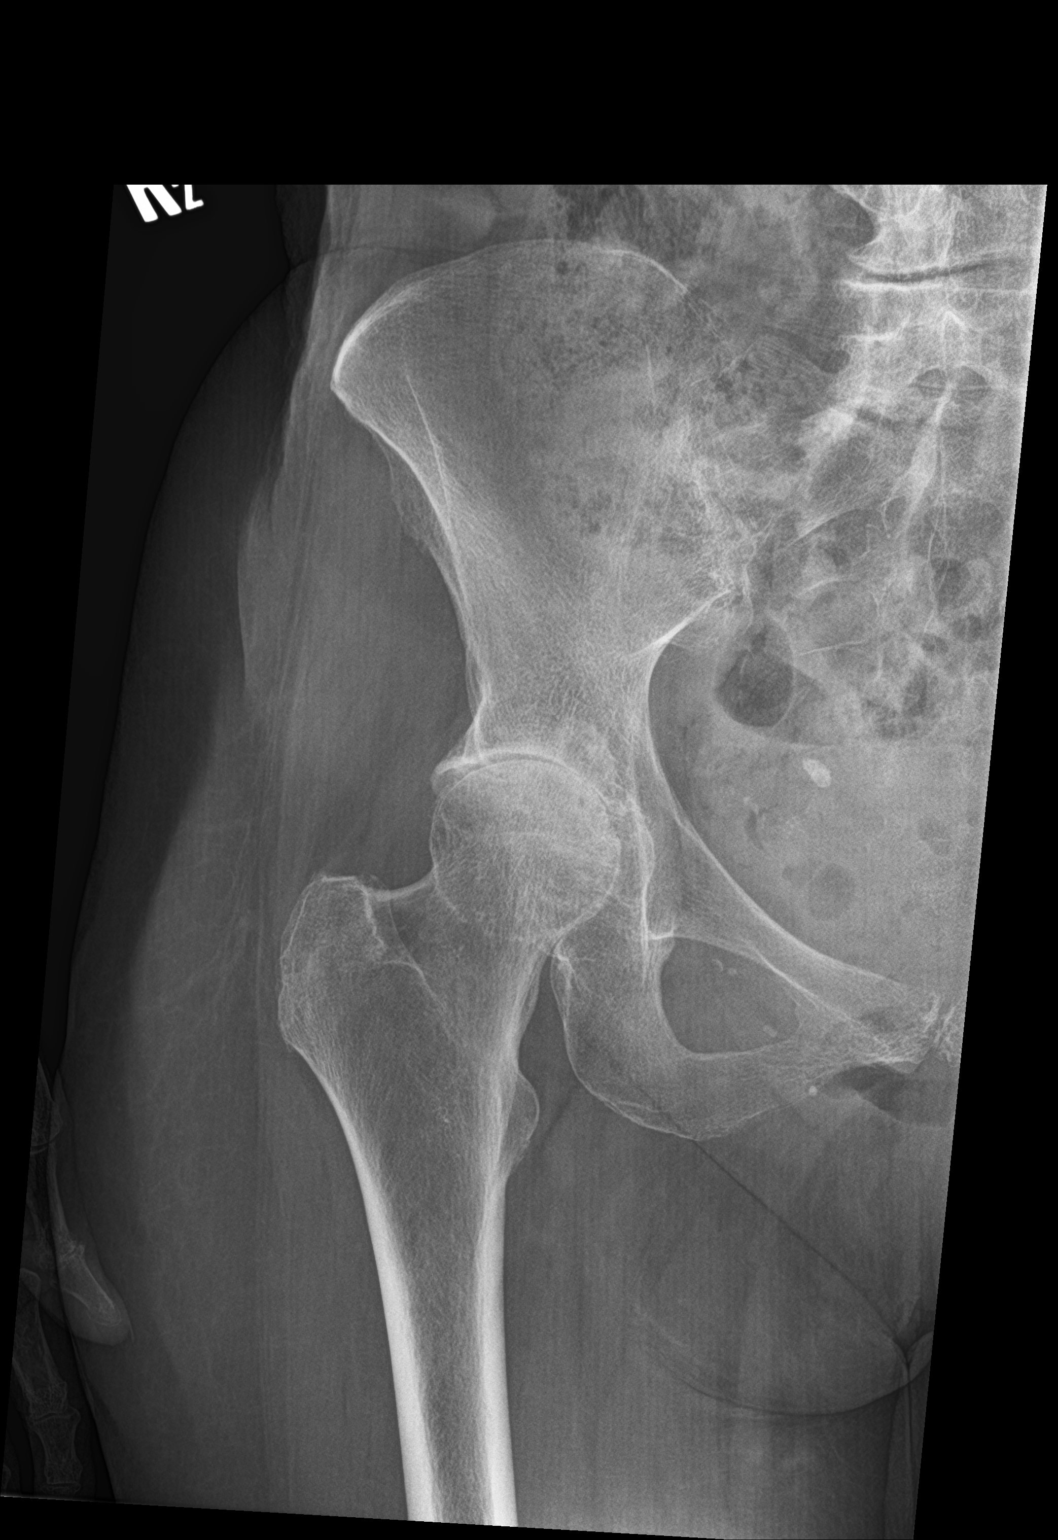
[im 3/3]
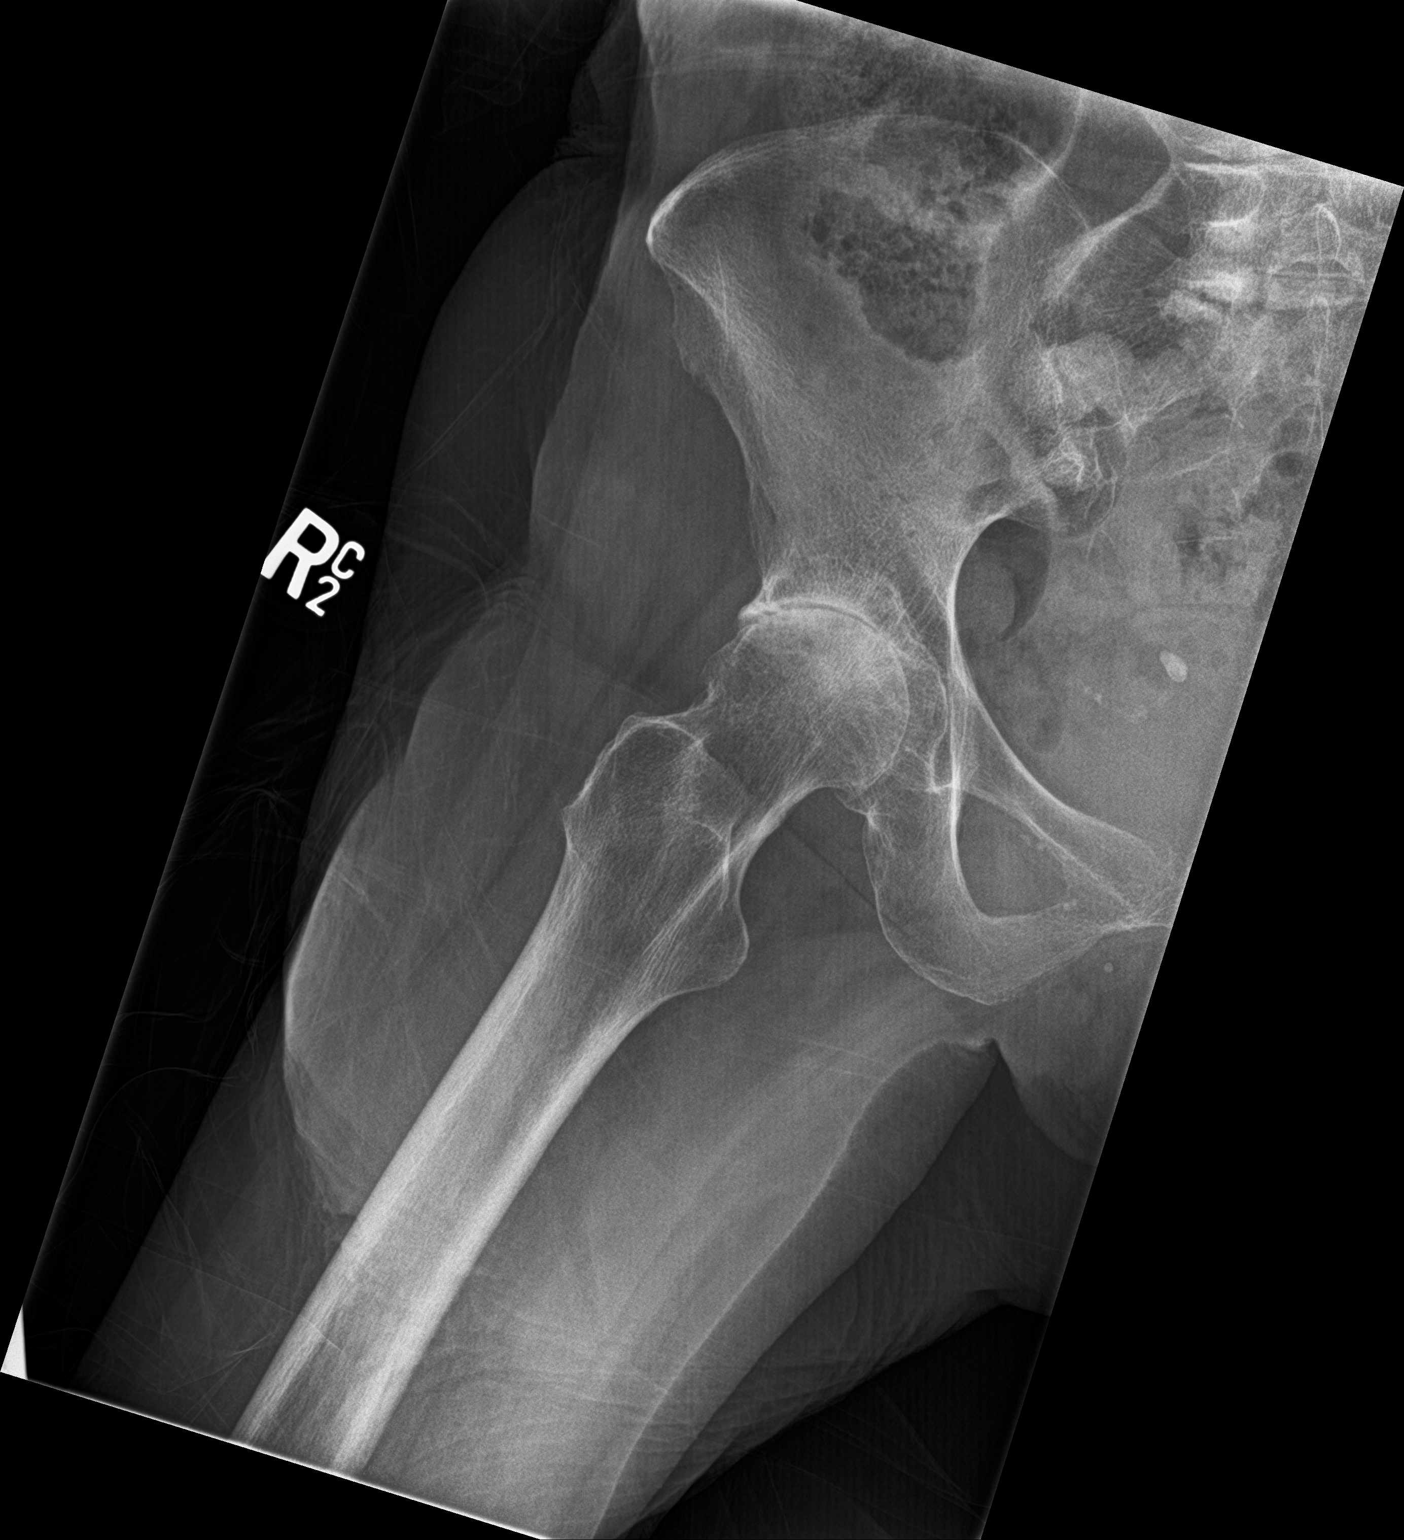

[3 of 3 positions shown; findings below may reference images not displayed]

FINDINGS: Linear sclerotic density in the femoral neck, not seen on prior
exam. Cortical margins of the pelvis are otherwise intact. Pubic
rami are intact. Pubic symphysis and sacroiliac joints are
congruent. Advanced right hip osteoarthritis with near complete
joint space loss, subchondral sclerosis and cystic change. Mild
femoral and acetabular osteophytes.
IMPRESSION: 1. Linear sclerotic density in the right femoral neck may represent
a nondisplaced fracture in the setting of fall. Consider further
evaluation with cross-sectional imaging, MRI is more sensitive than
CT.
2. Advanced right hip osteoarthritis.

## 2020-07-22 ENCOUNTER — Other Ambulatory Visit: Payer: Self-pay | Admitting: *Deleted

## 2020-07-22 DIAGNOSIS — I10 Essential (primary) hypertension: Secondary | ICD-10-CM

## 2020-07-22 MED ORDER — ALENDRONATE SODIUM 70 MG PO TABS: 70.0000 mg | ORAL_TABLET | ORAL | 3 refills | Status: AC

## 2020-07-22 MED ORDER — MELOXICAM 15 MG PO TABS: 15.0000 mg | ORAL_TABLET | Freq: Every day | ORAL | 6 refills | Status: AC

## 2020-07-22 MED ORDER — VITAMIN D 25 MCG (1000 UNIT) PO TABS: 1000.0000 [IU] | ORAL_TABLET | Freq: Every day | ORAL | 3 refills | Status: AC

## 2020-07-22 MED ORDER — METOPROLOL SUCCINATE ER 25 MG PO TB24: 25.0000 mg | ORAL_TABLET | Freq: Every day | ORAL | 3 refills | Status: AC
